# Patient Record
Sex: Female | Born: 1960 | Race: Black or African American | Hispanic: No | Marital: Single | Smoking: Never smoker
Health system: Southern US, Community
[De-identification: ages and names within clinical notes are randomized; demographics above are authoritative.]

## PROBLEM LIST (undated history)

## (undated) DIAGNOSIS — J45909 Unspecified asthma, uncomplicated: Secondary | ICD-10-CM

## (undated) HISTORY — PX: CHOLECYSTECTOMY: SHX55

---

## 2019-08-14 ENCOUNTER — Other Ambulatory Visit: Payer: Self-pay

## 2019-08-14 ENCOUNTER — Other Ambulatory Visit: Payer: Self-pay | Admitting: Nurse Practitioner

## 2019-08-14 ENCOUNTER — Emergency Department (HOSPITAL_BASED_OUTPATIENT_CLINIC_OR_DEPARTMENT_OTHER)
Admission: EM | Admit: 2019-08-14 | Discharge: 2019-08-14 | Disposition: A | Discharge status: Home / Self Care | Payer: Medicare Other | Attending: Emergency Medicine | Admitting: Emergency Medicine

## 2019-08-14 ENCOUNTER — Encounter (HOSPITAL_BASED_OUTPATIENT_CLINIC_OR_DEPARTMENT_OTHER): Payer: Self-pay | Admitting: *Deleted

## 2019-08-14 ENCOUNTER — Telehealth: Payer: Self-pay | Admitting: Nurse Practitioner

## 2019-08-14 ENCOUNTER — Emergency Department (HOSPITAL_BASED_OUTPATIENT_CLINIC_OR_DEPARTMENT_OTHER): Payer: Medicare Other

## 2019-08-14 DIAGNOSIS — J45909 Unspecified asthma, uncomplicated: Secondary | ICD-10-CM | POA: Insufficient documentation

## 2019-08-14 DIAGNOSIS — Z7951 Long term (current) use of inhaled steroids: Secondary | ICD-10-CM | POA: Insufficient documentation

## 2019-08-14 DIAGNOSIS — J1282 Pneumonia due to coronavirus disease 2019: Secondary | ICD-10-CM | POA: Insufficient documentation

## 2019-08-14 DIAGNOSIS — U071 COVID-19: Secondary | ICD-10-CM

## 2019-08-14 HISTORY — DX: Unspecified asthma, uncomplicated: J45.909

## 2019-08-14 LAB — SARS CORONAVIRUS 2 BY RT PCR (HOSPITAL ORDER, PERFORMED IN ~~LOC~~ HOSPITAL LAB): SARS Coronavirus 2: POSITIVE — AB

## 2019-08-14 MED ORDER — DEXAMETHASONE SODIUM PHOSPHATE 10 MG/ML IJ SOLN
8.0000 mg | Freq: Once | INTRAMUSCULAR | Status: AC
  Administered 2019-08-14: 8 mg via INTRAMUSCULAR
  Filled 2019-08-14: qty 1

## 2019-08-14 MED ORDER — BUDESONIDE-FORMOTEROL FUMARATE 80-4.5 MCG/ACT IN AERO
2.0000 | INHALATION_SPRAY | Freq: Two times a day (BID) | RESPIRATORY_TRACT | 12 refills | Status: AC
Start: 2019-08-14 — End: ?

## 2019-08-14 MED ORDER — DEXAMETHASONE SODIUM PHOSPHATE 10 MG/ML IJ SOLN: 8.0000 mg | Freq: Once | INTRAMUSCULAR | Status: DC

## 2019-08-14 MED ORDER — ALBUTEROL SULFATE HFA 108 (90 BASE) MCG/ACT IN AERS
2.0000 | INHALATION_SPRAY | Freq: Once | RESPIRATORY_TRACT | Status: AC
  Administered 2019-08-14: 2 via RESPIRATORY_TRACT
  Filled 2019-08-14: qty 6.7

## 2019-08-14 NOTE — ED Notes (Signed)
Pt ambulated in room with O2 sat stayed between 95-100% with HR 93-102

## 2019-08-14 NOTE — ED Triage Notes (Signed)
She was exposed to Covid. Sob with hx of asthma. She moved here with her sister last week.

## 2019-08-14 NOTE — Progress Notes (Signed)
I connected by phone with Maria Mora on 08/14/2019 at 8:28 PM to discuss the potential use of a new treatment for mild to moderate COVID-19 viral infection in non-hospitalized patients.  This patient is a 59 y.o. female that meets the FDA criteria for Emergency Use Authorization of COVID monoclonal antibody casirivimab/imdevimab.  Has a (+) direct SARS-CoV-2 viral test result  Has mild or moderate COVID-19   Is NOT hospitalized due to COVID-19  Is within 10 days of symptom onset  Has at least one of the high risk factor(s) for progression to severe COVID-19 and/or hospitalization as defined in EUA.  Specific high risk criteria : Chronic Lung Disease and BMI >25.    I have spoken and communicated the following to the patient or parent/caregiver regarding COVID monoclonal antibody treatment:  1. FDA has authorized the emergency use for the treatment of mild to moderate COVID-19 in adults and pediatric patients with positive results of direct SARS-CoV-2 viral testing who are 26 years of age and older weighing at least 40 kg, and who are at high risk for progressing to severe COVID-19 and/or hospitalization.  2. The significant known and potential risks and benefits of COVID monoclonal antibody, and the extent to which such potential risks and benefits are unknown.  3. Information on available alternative treatments and the risks and benefits of those alternatives, including clinical trials.  4. Patients treated with COVID monoclonal antibody should continue to self-isolate and use infection control measures (e.g., wear mask, isolate, social distance, avoid sharing personal items, clean and disinfect "high touch" surfaces, and frequent handwashing) according to CDC guidelines.   5. The patient or parent/caregiver has the option to accept or refuse COVID monoclonal antibody treatment.  After reviewing this information with the patient, The patient agreed to proceed with receiving  casirivimab\imdevimab infusion and will be provided a copy of the Fact sheet prior to receiving the infusion. Jake Samples Pickenpack-Cousar 08/14/2019 8:28 PM
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
## 2019-08-14 NOTE — Discharge Instructions (Addendum)
Hello Maria Mora,   You have been scheduled to receive Regeneron (the monoclonal antibody we discussed) on : Thursday August 16, 2019 at 12:30 pm (please arrive 15 min early). I will call tomorrow to confirm transportation details and address.     The address for the infusion clinic site is:  --GPS address is [address] - the parking is located near [employer] building where you will see  COVID19 Infusion feather banner marking the entrance to parking.   (see photos below)            --Enter into the 2nd entrance where the "wave, flag banner" is at the road. Turn into this 2nd entrance and immediately turn left to park in 1 of the 5 parking spots. Please stay in your car and call the desk for assistance inside [phone].   --Average time in department is roughly 3 hours for Regeneron treatment - this includes preparation of the medication, IV start and the required 1 hour monitoring after the infusion.    Should you develop worsening shortness of breath, chest pain or severe breathing problems please do not wait for this appointment and go to the Emergency room for evaluation and treatment.   The day of your visit you should:  Get plenty of rest the night before and drink plenty of water  Eat a light meal/snack before coming and take your medications as prescribed   Wear warm, comfortable clothes with a shirt that can roll-up over the elbow (will need IV start).   Wear a mask   Consider bringing some activity to help pass the time  We are starting to see some insurances bill for the administration of the medication - we are learning more information but you may receive a bill after your appointment. It has ranged from $300-640. We can get you in touch with Customer Service team for billing to help if you incur a bill.   I hope this helps find you feeling better,  [name], NP    You have a pneumonia that is related to your COVID-19 infection.  I have given your  Decadron here in the ED as well as a albuterol inhaler to take home.  I will also order you Symbicort given that you have recently run out of that medication.   You will need to follow-up with the infusion center, as directed.  I would also like for you to get established with a primary care provider here locally if you plan to stay in the [place], and see area.  If you do ultimately for [place], as you suggest, you have established with a primary care provider.  I also encourage you to obtain a pulse oximeter at a pharmacy in a thermometer so you can check your pulse ox and temperature regularly.  Return to the ER seek immediate medical attention should experience any new or worsening symptoms.

## 2019-08-14 NOTE — Telephone Encounter (Signed)
Called to discuss with Maria Mora about Covid symptoms and the use of casirivimab/imdevimab, a combination monoclonal antibody infusion for those with mild to moderate Covid symptoms and at a high risk of hospitalization.     Pt is qualified for this infusion at the [facility] infusion center due to co-morbid conditions (BMI >25 and asthma). Symptom onset of shortness of breath 08/13/19.   Patient verbalized understanding of infusion and appointment details as requested. Scheduled for 08/16/19 @ 1230.   Willette Alma, AGPCNP-BC

## 2019-08-15 MED ORDER — SODIUM CHLORIDE 0.9 % IV SOLN
1200.0000 mg | Freq: Once | INTRAVENOUS | Status: AC
  Administered 2019-08-16: 1200 mg via INTRAVENOUS
  Filled 2019-08-15: qty 400
  Filled 2019-08-15: qty 10

## 2019-08-16 ENCOUNTER — Ambulatory Visit (HOSPITAL_COMMUNITY)
Admission: RE | Admit: 2019-08-16 | Discharge: 2019-08-16 | Disposition: A | Discharge status: Home / Self Care | Payer: Medicare Other | Source: Ambulatory Visit | Attending: Pulmonary Disease | Admitting: Pulmonary Disease

## 2019-08-16 DIAGNOSIS — Z23 Encounter for immunization: Secondary | ICD-10-CM | POA: Diagnosis not present

## 2019-08-16 DIAGNOSIS — J45909 Unspecified asthma, uncomplicated: Secondary | ICD-10-CM

## 2019-08-16 DIAGNOSIS — U071 COVID-19: Secondary | ICD-10-CM | POA: Diagnosis present

## 2019-08-16 MED ORDER — FAMOTIDINE IN NACL 20-0.9 MG/50ML-% IV SOLN: 20.0000 mg | Freq: Once | INTRAVENOUS | Status: DC | PRN

## 2019-08-16 MED ORDER — ALBUTEROL SULFATE HFA 108 (90 BASE) MCG/ACT IN AERS: 2.0000 | INHALATION_SPRAY | Freq: Once | RESPIRATORY_TRACT | Status: DC | PRN

## 2019-08-16 MED ORDER — DIPHENHYDRAMINE HCL 50 MG/ML IJ SOLN: 50.0000 mg | Freq: Once | INTRAMUSCULAR | Status: DC | PRN

## 2019-08-16 MED ORDER — METHYLPREDNISOLONE SODIUM SUCC 125 MG IJ SOLR: 125.0000 mg | Freq: Once | INTRAMUSCULAR | Status: DC | PRN

## 2019-08-16 MED ORDER — ONDANSETRON HCL 4 MG/2ML IJ SOLN: 4.0000 mg | Freq: Once | INTRAMUSCULAR | Status: DC

## 2019-08-16 MED ORDER — EPINEPHRINE 0.3 MG/0.3ML IJ SOAJ: 0.3000 mg | Freq: Once | INTRAMUSCULAR | Status: DC | PRN

## 2019-08-16 MED ORDER — SODIUM CHLORIDE 0.9 % IV SOLN: INTRAVENOUS | Status: DC | PRN

## 2019-08-16 NOTE — Discharge Instructions (Signed)

## 2019-08-17 ENCOUNTER — Encounter (HOSPITAL_COMMUNITY): Payer: Self-pay | Admitting: *Deleted

## 2019-08-17 ENCOUNTER — Emergency Department (HOSPITAL_COMMUNITY)
Admission: EM | Admit: 2019-08-17 | Discharge: 2019-08-17 | Disposition: A | Discharge status: Home / Self Care | Payer: Medicare Other | Attending: Emergency Medicine | Admitting: Emergency Medicine

## 2019-08-17 DIAGNOSIS — R1030 Lower abdominal pain, unspecified: Secondary | ICD-10-CM | POA: Diagnosis not present

## 2019-08-17 DIAGNOSIS — R197 Diarrhea, unspecified: Secondary | ICD-10-CM | POA: Insufficient documentation

## 2019-08-17 DIAGNOSIS — J45909 Unspecified asthma, uncomplicated: Secondary | ICD-10-CM | POA: Insufficient documentation

## 2019-08-17 DIAGNOSIS — R112 Nausea with vomiting, unspecified: Secondary | ICD-10-CM | POA: Diagnosis present

## 2019-08-17 DIAGNOSIS — U071 COVID-19: Secondary | ICD-10-CM | POA: Diagnosis not present

## 2019-08-17 DIAGNOSIS — R109 Unspecified abdominal pain: Secondary | ICD-10-CM | POA: Insufficient documentation

## 2019-08-17 DIAGNOSIS — R079 Chest pain, unspecified: Secondary | ICD-10-CM | POA: Diagnosis not present

## 2019-08-17 LAB — COMPREHENSIVE METABOLIC PANEL
ALT: 19 U/L (ref 0–44)
AST: 28 U/L (ref 15–41)
Albumin: 3 g/dL — ABNORMAL LOW (ref 3.5–5.0)
Alkaline Phosphatase: 46 U/L (ref 38–126)
Anion gap: 10 (ref 5–15)
BUN: 18 mg/dL (ref 6–20)
CO2: 21 mmol/L — ABNORMAL LOW (ref 22–32)
Calcium: 7.7 mg/dL — ABNORMAL LOW (ref 8.9–10.3)
Chloride: 104 mmol/L (ref 98–111)
Creatinine, Ser: 0.88 mg/dL (ref 0.44–1.00)
GFR calc Af Amer: >60 mL/min (ref >60)
GFR calc non Af Amer: >60 mL/min (ref >60)
Glucose, Bld: 190 mg/dL — ABNORMAL HIGH (ref 70–99)
Potassium: 3.3 mmol/L — ABNORMAL LOW (ref 3.5–5.1)
Sodium: 135 mmol/L (ref 135–145)
Total Bilirubin: 0.8 mg/dL (ref 0.3–1.2)
Total Protein: 6.8 g/dL (ref 6.5–8.1)

## 2019-08-17 LAB — CBC
HCT: 30.3 % — ABNORMAL LOW (ref 36.0–46.0)
Hemoglobin: 10.1 g/dL — ABNORMAL LOW (ref 12.0–15.0)
MCH: 27.6 pg (ref 26.0–34.0)
MCHC: 33.3 g/dL (ref 30.0–36.0)
MCV: 82.8 fL (ref 80.0–100.0)
Platelets: 207 10*3/uL (ref 150–400)
RBC: 3.66 MIL/uL — ABNORMAL LOW (ref 3.87–5.11)
RDW: 14.8 % (ref 11.5–15.5)
WBC: 5.4 10*3/uL (ref 4.0–10.5)
nRBC: 0 % (ref 0.0–0.2)

## 2019-08-17 LAB — LIPASE, BLOOD: Lipase: 20 U/L (ref 11–51)

## 2019-08-17 LAB — HCG, QUANTITATIVE, PREGNANCY: hCG, Beta Chain, Quant, S: 2 m[IU]/mL (ref <5)

## 2019-08-17 MED ORDER — ONDANSETRON HCL 8 MG PO TABS
8.0000 mg | ORAL_TABLET | Freq: Three times a day (TID) | ORAL | 0 refills | Status: AC | PRN
Start: 2019-08-17 — End: ?

## 2019-08-17 MED ORDER — ONDANSETRON 8 MG PO TBDP
8.0000 mg | ORAL_TABLET | Freq: Once | ORAL | Status: AC
  Administered 2019-08-17: 8 mg via ORAL
  Filled 2019-08-17 (×2): qty 1

## 2019-08-17 NOTE — ED Triage Notes (Signed)
Per EMS, pt tested positive for COVID 3 days ago, complains of n/v/d. Pt states she received an infusion for COVID. Pt received 300cc NS en route.

## 2019-08-17 NOTE — Discharge Instructions (Signed)
Continue to isolate yourself, wear a mask in public and distance yourself from people.  Stay on a clear liquid diet, until your vomiting improves.  We are prescribing an nausea pill to use as needed.  You can expect to be sick another week or 2.  Avoid traveling in a community vehicle, until you have recovered from the Covid infection.

## 2019-08-17 NOTE — Progress Notes (Signed)
  Diagnosis: COVID-19  Physician: Dr. Patrick Wright  Procedure: Covid Infusion Clinic Med: casirivimab\imdevimab infusion - Provided patient with casirivimab\imdevimab fact sheet for patients, parents and caregivers prior to infusion.  Complications: No immediate complications noted.  Discharge: Discharged home   Maria Mora 08/17/2019  

## 2019-08-17 NOTE — Social Work (Signed)
[facility] CSW working remotely was able to coordinate transport through [employer].  CSW informed RN

## 2019-08-17 NOTE — ED Provider Notes (Signed)
[facility]-EMERGENCY DEPT Provider Note   CSN: [id number] Arrival date & time: 08/17/19  1234     History Chief Complaint  Patient presents with  . Emesis  . Diarrhea  . Abdominal Pain  . Chest Pain    Jaleya Pebley is a 59 y.o. female.  HPI She presents for evaluation of nausea and vomiting, onset yesterday.  She received a monoclonal antibody infusion for Covid infection, yesterday.  She has had cold symptoms for about 5 days.  She denies fever, chills, blood in emesis, focal weakness paresthesia.  She has crampy lower abdominal pain associated with decreased stooling today.  She is traveling, visiting friends here in [place], from [place].  There are no other known modifying factors.    Past Medical History:  Diagnosis Date  . Asthma     There are no problems to display for this patient.   Past Surgical History:  Procedure Laterality Date  . CHOLECYSTECTOMY       OB History   No obstetric history on file.     No family history on file.  Social History   Tobacco Use  . Smoking status: Never Smoker  . Smokeless tobacco: Never Used  Substance Use Topics  . Alcohol use: Not Currently  . Drug use: Never    Home Medications Prior to Admission medications   Medication Sig Start Date End Date Taking? Authorizing Provider  budesonide-formoterol (SYMBICORT) 80-4.5 MCG/ACT inhaler Inhale 2 puffs into the lungs 2 (two) times daily. 08/14/19   Lorelee New, PA-C    Allergies    Patient has no known allergies.  Review of Systems   Review of Systems  All other systems reviewed and are negative.   Physical Exam Updated Vital Signs BP 110/69 (BP Location: Left Arm)   Pulse 92   Temp 99.6 F (37.6 C) (Oral)   Resp (!) 26   Ht 5\' 4"  (1.626 m)   Wt 92.1 kg   SpO2 91%   BMI 34.84 kg/m   Physical Exam Vitals and nursing note reviewed.  Constitutional:      Appearance: She is well-developed.  HENT:     Head: Normocephalic  and atraumatic.     Right Ear: External ear normal.     Left Ear: External ear normal.  Eyes:     Conjunctiva/sclera: Conjunctivae normal.     Pupils: Pupils are equal, round, and reactive to light.  Neck:     Trachea: Phonation normal.  Cardiovascular:     Rate and Rhythm: Normal rate.  Pulmonary:     Effort: Pulmonary effort is normal.  Abdominal:     General: There is no distension.     Palpations: Abdomen is soft. There is no mass.     Tenderness: There is abdominal tenderness (Lower abdomen, diffuse, mild).     Hernia: No hernia is present.  Musculoskeletal:        General: Normal range of motion.     Cervical back: Normal range of motion and neck supple.  Skin:    General: Skin is warm and dry.  Neurological:     Mental Status: She is alert and oriented to person, place, and time.     Cranial Nerves: No cranial nerve deficit.     Sensory: No sensory deficit.     Motor: No abnormal muscle tone.     Coordination: Coordination normal.  Psychiatric:        Mood and Affect: Mood normal.  Behavior: Behavior normal.        Thought Content: Thought content normal.        Judgment: Judgment normal.     ED Results / Procedures / Treatments   Labs (all labs ordered are listed, but only abnormal results are displayed) Labs Reviewed  COMPREHENSIVE METABOLIC PANEL - Abnormal; Notable for the following components:      Result Value   Potassium 3.3 (*)    CO2 21 (*)    Glucose, Bld 190 (*)    Calcium 7.7 (*)    Albumin 3.0 (*)    All other components within normal limits  CBC - Abnormal; Notable for the following components:   RBC 3.66 (*)    Hemoglobin 10.1 (*)    HCT 30.3 (*)    All other components within normal limits  LIPASE, BLOOD  HCG, QUANTITATIVE, PREGNANCY  URINALYSIS, ROUTINE W REFLEX MICROSCOPIC    EKG None  Radiology No results found.  Procedures Procedures (including critical care time)  Medications Ordered in ED Medications  ondansetron  (ZOFRAN-ODT) disintegrating tablet 8 mg (8 mg Oral Given 08/17/19 1435)    ED Course  I have reviewed the triage vital signs and the nursing notes.  Pertinent labs & imaging results that were available during my care of the patient were reviewed by me and considered in my medical decision making (see chart for details).  Clinical Course as of Aug 16 1529  Fri Aug 17, 2019  1504 Normal  hCG, quantitative, pregnancy [EW]  1504 Normal   [EW]  1504 Normal except hemoglobin low  CBC(!) [EW]  1505 Normal except potassium low, CO2 low, glucose high, calcium low, albumin low  Comprehensive metabolic panel(!) [EW]  1530 AST: 28 [EW]    Clinical Course User Index [EW] Mancel Bale, MD   MDM Rules/Calculators/A&P                           Patient Vitals for the past 24 hrs:  BP Temp Temp src Pulse Resp SpO2 Height Weight  08/17/19 1254 -- -- -- -- -- -- 5\' 4"  (1.626 m) 92.1 kg  08/17/19 1253 110/69 99.6 F (37.6 C) Oral 92 (!) 26 91 % -- --    3:28 PM Reevaluation with update and discussion. After initial assessment and treatment, an updated evaluation reveals no change in clinical status, findings discussed with patient and all questions were answered. 08/19/19   Medical Decision Making:  This patient is presenting for evaluation of nausea and vomiting, with Covid infection, which does require a range of treatment options, and is a complaint that involves a moderate risk of morbidity and mortality. The differential diagnoses include COVID-19 infection, medication intolerance, metabolic instability. I decided to review old records, and in summary middle-aged female with Covid illness, for 5 days, MAB infusion yesterday, presenting with short-term nausea and vomiting without diarrhea..  I did not require additional historical information from anyone.  Clinical Laboratory Tests Ordered, included CBC, Metabolic panel and Lipase, pregnancy test. Review indicates normal except  hemoglobin low, potassium low, CO2 low, glucose high, albumin low, calcium low.   Critical Interventions-clinical evaluation, laboratory testing, antiemetic given, observation reassessment  After These Interventions, the Patient was reevaluated and was found improved and stable for discharge.  Patient with known COVID-19 infection, presenting for symptomatic nausea and vomiting.  Patient improved after treatment stable for discharge.  No need for oxygen treatment or hospitalization at this time.  She received medical antibiotic injection yesterday.  Jaasia Viglione was evaluated in Emergency Department on 08/17/2019 for the symptoms described in the history of present illness. She was evaluated in the context of the global COVID-19 pandemic, which necessitated consideration that the patient might be at risk for infection with the SARS-CoV-2 virus that causes COVID-19. Institutional protocols and algorithms that pertain to the evaluation of patients at risk for COVID-19 are in a state of rapid change based on information released by regulatory bodies including the CDC and federal and state organizations. These policies and algorithms were followed during the patient's care in the ED.   CRITICAL CARE-no Performed by: Mancel Bale  Nursing Notes Reviewed/ Care Coordinated Applicable Imaging Reviewed Interpretation of Laboratory Data incorporated into ED treatment  The patient appears reasonably screened and/or stabilized for discharge and I doubt any other medical condition or other [facility] requiring further screening, evaluation, or treatment in the ED at this time prior to discharge.  Plan: Home Medications-RTC as needed; Home Treatments-gradual advance diet; return here if the recommended treatment, does not improve the symptoms; Recommended follow up-PCP of choice.     Final Clinical Impression(s) / ED Diagnoses Final diagnoses:  None    Rx / DC Orders ED Discharge Orders    None        Mancel Bale, MD 08/17/19 2045

## 2019-08-17 NOTE — ED Notes (Addendum)
Delay in discharge due to pt not having a ride home. Called social work for assistance.

## 2021-06-26 IMAGING — DX DG CHEST 1V PORT
1 series · 1 of 1 positions shown · non-contrast
Comparison: None.

CLINICAL DATA: Positive Covid today.   Sob with hx of asthma.

EXAM:
PORTABLE CHEST 1 VIEW

[chest ap]
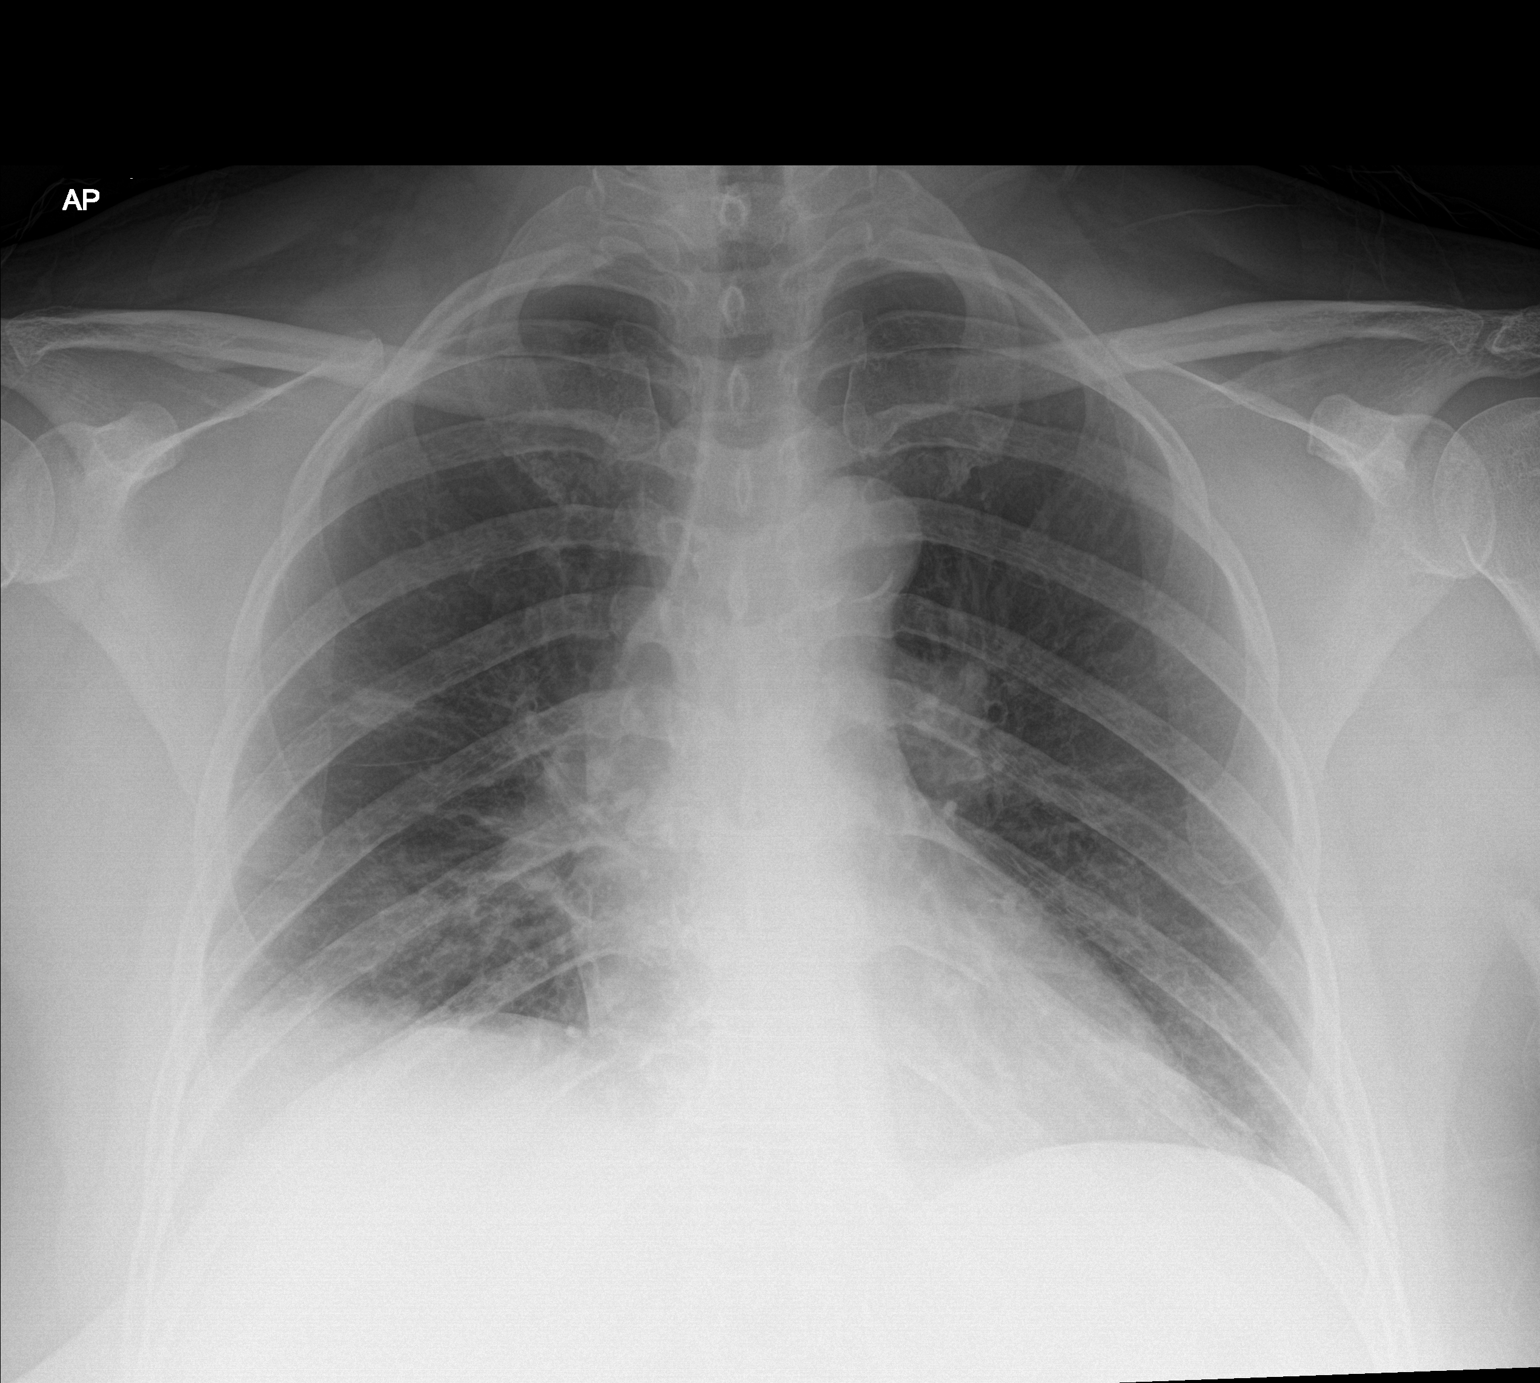

[1 of 1 positions shown; findings below may reference images not displayed]

FINDINGS: There is hazy airspace opacity at the right lung base partly
silhouetting the hemidiaphragm. Small area of hazy airspace opacity
is noted in the right mid lung. Remainder of the lungs is clear.

Cardiac silhouette is normal in size. No mediastinal or hilar masses
or evidence of adenopathy.

No convincing pleural effusion and no pneumothorax.

Skeletal structures are grossly intact.
IMPRESSION: 1. Hazy areas airspace opacity in the right mid and lower lung
suspicious for pneumonia.
